# Patient Record
Sex: Female | Born: 1995 | Race: White | Hispanic: Yes | Marital: Married | Smoking: Never smoker
Health system: Southern US, Community
[De-identification: ages and names within clinical notes are randomized; demographics above are authoritative.]

---
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
## 2015-06-05 ENCOUNTER — Ambulatory Visit: Payer: Medicaid Other | Admitting: Obstetrics & Gynecology

## 2015-07-07 ENCOUNTER — Encounter: Payer: Medicaid Other | Admitting: Obstetrics & Gynecology

## 2021-09-11 ENCOUNTER — Other Ambulatory Visit: Payer: Self-pay

## 2024-05-11 ENCOUNTER — Ambulatory Visit
Admission: RE | Admit: 2024-05-11 | Discharge: 2024-05-11 | Disposition: A | Discharge status: Home / Self Care | Payer: Self-pay | Source: Ambulatory Visit

## 2024-05-11 VITALS — BP 137/85 | HR 82 | Temp 98.4°F | Resp 20

## 2024-05-11 DIAGNOSIS — H5789 Other specified disorders of eye and adnexa: Secondary | ICD-10-CM | POA: Diagnosis not present

## 2024-05-11 MED ORDER — OFLOXACIN 0.3 % OP SOLN: OPHTHALMIC | 0 refills | Status: AC

## 2024-05-11 NOTE — Discharge Instructions (Signed)
 Please follow up with your eye doctor. I have prescribed an antibiotic eyedrop. Keep contact lenses out.

## 2024-05-11 NOTE — ED Provider Notes (Signed)
 [facility]    CSN: [id number] Arrival date & time: 05/11/24  1019      History   Chief Complaint Chief Complaint  Patient presents with   Eye Problem    Possible eye infection or pink eye - Entered by patient   Eye Drainage    HPI Lindsey Mathis is a 28 y.o. female.   Patient presents with bilateral eye irritation, drainage, itchiness. Symptoms started last night. Reports that she had purulent drainage from both eyes. Denies any current blurry vision. She wears contacts daily but does not sleep in them. She took them out after symptoms started. Denies trauma or foreign body to the eyes. Has used an allergy eye drop with some improvement.    Eye Problem   History reviewed. No pertinent past medical history.  Patient Active Problem List   Diagnosis Date Noted   Sinusitis 05/05/2014    History reviewed. No pertinent surgical history.  OB History     Gravida  0   Para  0   Term  0   Preterm  0   AB  0   Living         SAB  0   IAB  0   Ectopic  0   Multiple      Live Births               Home Medications    Prior to Admission medications   Medication Sig Start Date End Date Taking? Authorizing Provider  ofloxacin (OCUFLOX) 0.3 % ophthalmic solution Place 1 drop into both eyes every 4 (four) hours for 2 days, THEN 1 drop 4 (four) times daily for 5 days. 05/11/24 05/18/24 Yes Phylis Javed, Dewey Fordyce, FNP  Prenatal Vit-Fe Fumarate-FA (MULTIVITAMIN-PRENATAL) 27-0.8 MG TABS tablet Take 1 tablet by mouth daily at 12 noon.   Yes [provider]  fluticasone  (FLONASE ) 50 MCG/ACT nasal spray Place 2 sprays into both nostrils daily. Patient not taking: Reported on 05/11/2024 05/05/14   Tammi Falconer, MD    Family History Family History  Problem Relation Age of Onset   Healthy Mother    Diabetes Father     Social History Social History   Tobacco Use   Smoking status: Never  Vaping Use   Vaping status: Never Used  Substance Use  Topics   Alcohol use: Yes    Comment: socially   Drug use: No     Allergies   Patient has no known allergies.   Review of Systems Review of Systems Per HPI  Physical Exam Triage Vital Signs ED Triage Vitals  Encounter Vitals Group     BP 05/11/24 1035 137/85     Girls Systolic BP Percentile --      Girls Diastolic BP Percentile --      Boys Systolic BP Percentile --      Boys Diastolic BP Percentile --      Pulse Rate 05/11/24 1035 82     Resp 05/11/24 1035 20     Temp 05/11/24 1035 98.4 F (36.9 C)     Temp Source 05/11/24 1035 Oral     SpO2 05/11/24 1035 96 %     Weight --      Height --      Head Circumference --      Peak Flow --      Pain Score 05/11/24 1031 3     Pain Loc --      Pain Education --  Exclude from Growth Chart --    No data found.  Updated Vital Signs BP 137/85 (BP Location: Right Arm)   Pulse 82   Temp 98.4 F (36.9 C) (Oral)   Resp 20   LMP 04/19/2024 (Exact Date)   SpO2 96%   Visual Acuity Right Eye Distance: 20/30 Left Eye Distance: 20/25 Bilateral Distance: 20/30  Right Eye Near:   Left Eye Near:    Bilateral Near:     Physical Exam Constitutional:      General: She is not in acute distress.    Appearance: Normal appearance. She is not toxic-appearing or diaphoretic.  HENT:     Head: Normocephalic and atraumatic.   Eyes:     General: Lids are normal. Lids are everted, no foreign bodies appreciated. Vision grossly intact. Gaze aligned appropriately.     Extraocular Movements: Extraocular movements intact.     Conjunctiva/sclera:     Right eye: Right conjunctiva is injected. No chemosis, exudate or hemorrhage.    Left eye: Left conjunctiva is injected. No chemosis, exudate or hemorrhage.    Pupils: Pupils are equal, round, and reactive to light.     Comments: Mild inflammation noted to bilateral conjunctiva. No drainage or swelling noted. Sclera appears normal.   Pulmonary:     Effort: Pulmonary effort is normal.    Neurological:     General: No focal deficit present.     Mental Status: She is alert and oriented to person, place, and time. Mental status is at baseline.   Psychiatric:        Mood and Affect: Mood normal.        Behavior: Behavior normal.        Thought Content: Thought content normal.        Judgment: Judgment normal.      UC Treatments / Results  Labs (all labs ordered are listed, but only abnormal results are displayed) Labs Reviewed - No data to display  EKG   Radiology No results found.  Procedures Procedures (including critical care time)  Medications Ordered in UC Medications - No data to display  Initial Impression / Assessment and Plan / UC Course  I have reviewed the triage vital signs and the nursing notes.  Pertinent labs & imaging results that were available during my care of the patient were reviewed by me and considered in my medical decision making (see chart for details).     Differential diagnoses include bacterial conjunctivitis, allergic conjunctivitis, contact lens keratitis. Low suspicion for keratitis given symptoms are bilateral and physical exam is not consistent with this, therefore fluorescein stain was deferred with shared decision making. I am most suspicion of allergy related symptoms so encouraged taking zytrec OTC. Will also prescribe ofloxacin eyedrops given patient reported purulent drainage to cover for bacterial infection. Visual acuity appears normal. Advised patient to keep her contacts lenses out for now and to wear glasses. Advised follow up as soon as possible with her established eye doctor given she wears contact lenses. Patient verbalized understanding and was agreeable with plan.  Final Clinical Impressions(s) / UC Diagnoses   Final diagnoses:  Eye irritation     Discharge Instructions      Please follow up with your eye doctor. I have prescribed an antibiotic eyedrop. Keep contact lenses out.     ED Prescriptions      Medication Sig Dispense Auth. Provider   ofloxacin (OCUFLOX) 0.3 % ophthalmic solution Place 1 drop into both eyes every 4 (four)  hours for 2 days, THEN 1 drop 4 (four) times daily for 5 days. 1.6 mL [place], [place]      PDMP not reviewed this encounter.   Dodson [place], [place] 05/11/24 1058

## 2024-05-11 NOTE — ED Triage Notes (Signed)
 Pt c/o eye discharge, itchiness x 1 day. Wears contacts on most days.Used eye drops.

## 2024-08-10 ENCOUNTER — Other Ambulatory Visit: Payer: Self-pay

## 2024-08-10 ENCOUNTER — Ambulatory Visit
Admission: RE | Admit: 2024-08-10 | Discharge: 2024-08-10 | Disposition: A | Discharge status: Home / Self Care | Payer: Self-pay | Attending: Family Medicine | Admitting: Family Medicine

## 2024-08-10 VITALS — BP 119/84 | HR 96 | Temp 98.2°F | Resp 18

## 2024-08-10 DIAGNOSIS — H6122 Impacted cerumen, left ear: Secondary | ICD-10-CM

## 2024-08-10 NOTE — ED Triage Notes (Signed)
 Pt states that about a week ago she started having a sensation that felt like she has something stuck in her ear. Pt states she used ear drops from the store to try to help but states that it felt like it made it worse. Pt denies pain or fevers. Pt states that sound is echo-y.

## 2024-08-10 NOTE — ED Provider Notes (Addendum)
 [facility]    CSN: [id number] Arrival date & time: 08/10/24  1056      History   Chief Complaint Chief Complaint  Patient presents with   Ear Fullness    Entered by patient    HPI Lindsey Mathis is a 28 y.o. female.   The history is provided by the patient.  Ear Fullness  Left ear fullness feels blocked abrupt onset after cleaning ear several days ago.  Admits history of similar symptoms in the past.  Denies drainage from ear, fever, chills, sweats, rhinorrhea, nasal congestion, sore throat, cough, swollen glands, dizziness or lightheadedness, recent travel, recent swimming.  No known drug allergies.  History reviewed. No pertinent past medical history.  Patient Active Problem List   Diagnosis Date Noted   Sinusitis 05/05/2014    History reviewed. No pertinent surgical history.  OB History     Gravida  0   Para  0   Term  0   Preterm  0   AB  0   Living         SAB  0   IAB  0   Ectopic  0   Multiple      Live Births               Home Medications    Prior to Admission medications   Not on File    Family History Family History  Problem Relation Age of Onset   Healthy Mother    Diabetes Father     Social History Social History   Tobacco Use   Smoking status: Never  Vaping Use   Vaping status: Never Used  Substance Use Topics   Alcohol use: Yes    Comment: socially   Drug use: No     Allergies   Patient has no known allergies.   Review of Systems Review of Systems   Physical Exam Triage Vital Signs ED Triage Vitals  Encounter Vitals Group     BP 08/10/24 1107 119/84     Girls Systolic BP Percentile --      Girls Diastolic BP Percentile --      Boys Systolic BP Percentile --      Boys Diastolic BP Percentile --      Pulse Rate 08/10/24 1107 96     Resp 08/10/24 1107 18     Temp 08/10/24 1107 98.2 F (36.8 C)     Temp Source 08/10/24 1107 Oral     SpO2 08/10/24 1107 97 %     Weight --       Height --      Head Circumference --      Peak Flow --      Pain Score 08/10/24 1106 0     Pain Loc --      Pain Education --      Exclude from Growth Chart --    No data found.  Updated Vital Signs BP 119/84 (BP Location: Right Arm)   Pulse 96   Temp 98.2 F (36.8 C) (Oral)   Resp 18   LMP 07/22/2024 (Approximate)   SpO2 97%   Visual Acuity Right Eye Distance:   Left Eye Distance:   Bilateral Distance:    Right Eye Near:   Left Eye Near:    Bilateral Near:     Physical Exam Vitals and nursing note reviewed.  Constitutional:      Appearance: She is not ill-appearing.  HENT:  Head: Normocephalic and atraumatic.     Right Ear: Tympanic membrane and ear canal normal.     Left Ear: There is impacted cerumen.     Nose: No rhinorrhea.     Mouth/Throat:     Mouth: Mucous membranes are moist.  Cardiovascular:     Rate and Rhythm: Normal rate.  Pulmonary:     Effort: Pulmonary effort is normal. No respiratory distress.  Musculoskeletal:     Cervical back: Neck supple.  Lymphadenopathy:     Cervical: No cervical adenopathy.  Neurological:     Mental Status: She is alert.      UC Treatments / Results  Labs (all labs ordered are listed, but only abnormal results are displayed) Labs Reviewed - No data to display  EKG   Radiology No results found.  Procedures Procedures (including critical care time)  Medications Ordered in UC Medications - No data to display  Initial Impression / Assessment and Plan / UC Course  I have reviewed the triage vital signs and the nursing notes.  Pertinent labs & imaging results that were available during my care of the patient were reviewed by me and considered in my medical decision making (see chart for details).     28 year old female with left ear fullness has cerumen impaction on exam, left ear irrigation performed by staff copious wax removed, more wax removed with curette however continues with some wax deep in  canal but not completely occluded recommend she continue over-the-counter drops and irrigate at home.  Follow-up as needed Final Clinical Impressions(s) / UC Diagnoses   Final diagnoses:  Impacted cerumen of left ear   Discharge Instructions   None    ED Prescriptions   None    PDMP not reviewed this encounter.   Thanh Mottern, PA 08/10/24 1506    Avari Gelles, [place] 08/10/24 1507

## 2024-11-16 ENCOUNTER — Ambulatory Visit
Admission: RE | Admit: 2024-11-16 | Discharge: 2024-11-16 | Disposition: A | Discharge status: Home / Self Care | Payer: Self-pay | Attending: Family Medicine | Admitting: Family Medicine

## 2024-11-16 ENCOUNTER — Other Ambulatory Visit: Payer: Self-pay

## 2024-11-16 VITALS — BP 122/85 | HR 84 | Temp 98.2°F | Resp 18

## 2024-11-16 DIAGNOSIS — H6122 Impacted cerumen, left ear: Secondary | ICD-10-CM | POA: Diagnosis not present

## 2024-11-16 NOTE — ED Provider Notes (Signed)
 [facility]    CSN: [id number] Arrival date & time: 11/16/24  1137      History   Chief Complaint Chief Complaint  Patient presents with   Ear Fullness    Entered by patient    HPI Shannon Haille Pardi is a 28 y.o. female.    Ear Fullness  Left ear fullness and decreased hearing for several days, has had similar symptoms in the past tried over-the-counter drops without relief.  States she never sees wax when she cleans her ear.  Denies problems on the right.  Denies ear pain, rhinorrhea, nasal congestion, sore throat, cough, headache, dizziness, drainage from ears, sore throat, cough.  Past Medical History:  Diagnosis Date   PCOS (polycystic ovarian syndrome)     Patient Active Problem List   Diagnosis Date Noted   Sinusitis 05/05/2014    History reviewed. No pertinent surgical history.  OB History     Gravida  0   Para  0   Term  0   Preterm  0   AB  0   Living         SAB  0   IAB  0   Ectopic  0   Multiple      Live Births               Home Medications    Prior to Admission medications  Medication Sig Start Date End Date Taking? Authorizing Provider  metformin (FORTAMET) 500 MG (OSM) 24 hr tablet Take 500 mg by mouth daily with breakfast.   Yes [provider]    Family History Family History  Problem Relation Age of Onset   Healthy Mother    Diabetes Father     Social History Social History[1]   Allergies   Patient has no known allergies.   Review of Systems Review of Systems   Physical Exam Triage Vital Signs ED Triage Vitals  Encounter Vitals Group     BP 11/16/24 1157 122/85     Girls Systolic BP Percentile --      Girls Diastolic BP Percentile --      Boys Systolic BP Percentile --      Boys Diastolic BP Percentile --      Pulse Rate 11/16/24 1157 84     Resp 11/16/24 1157 18     Temp 11/16/24 1157 98.2 F (36.8 C)     Temp Source 11/16/24 1157 Oral     SpO2 11/16/24 1157 96 %      Weight --      Height --      Head Circumference --      Peak Flow --      Pain Score 11/16/24 1155 0     Pain Loc --      Pain Education --      Exclude from Growth Chart --    No data found.  Updated Vital Signs BP 122/85 (BP Location: Right Arm)   Pulse 84   Temp 98.2 F (36.8 C) (Oral)   Resp 18   LMP 10/20/2024 (Approximate)   SpO2 96%   Visual Acuity Right Eye Distance:   Left Eye Distance:   Bilateral Distance:    Right Eye Near:   Left Eye Near:    Bilateral Near:     Physical Exam Vitals and nursing note reviewed.  Constitutional:      Appearance: She is not ill-appearing.  HENT:     Head: Normocephalic.  Right Ear: Tympanic membrane and ear canal normal.     Left Ear: There is impacted cerumen.     Nose: No rhinorrhea.  Eyes:     Conjunctiva/sclera: Conjunctivae normal.  Cardiovascular:     Rate and Rhythm: Normal rate.  Pulmonary:     Effort: Pulmonary effort is normal.  Musculoskeletal:     Cervical back: Neck supple.  Skin:    General: Skin is warm.  Neurological:     Mental Status: She is alert.   Use over-the-counter earwax removal drops follow-up with ENT next week   UC Treatments / Results  Labs (all labs ordered are listed, but only abnormal results are displayed) Labs Reviewed - No data to display  EKG   Radiology No results found.  Procedures Procedures (including critical care time)  Medications Ordered in UC Medications - No data to display  Initial Impression / Assessment and Plan / UC Course  I have reviewed the triage vital signs and the nursing notes.  Pertinent labs & imaging results that were available during my care of the patient were reviewed by me and considered in my medical decision making (see chart for details).     28 year old female left ear impacted with cerumen irrigation and curette removal unsuccessful recommend OTC earwax removal drops for 1 week then repeat irrigation at home in her PCPs  office or by ENT Final Clinical Impressions(s) / UC Diagnoses   Final diagnoses:  Impacted cerumen of left ear   Discharge Instructions   None    ED Prescriptions   None    PDMP not reviewed this encounter.    [1]  Social History Tobacco Use   Smoking status: Never  Vaping Use   Vaping status: Never Used  Substance Use Topics   Alcohol use: Yes    Comment: socially   Drug use: No     Danny Yackley, PA 11/16/24 1312  "

## 2024-11-16 NOTE — ED Triage Notes (Addendum)
 Pt states she has fullness in her LT ear. Pt states she came in here before and we could not clear it. Pt states she tried the drops and it doesn't seem like its helped. Pt states she feels like its gotten worse. Pt states she cannot hear in her LT ear. Pt denies any pain. Pt states this has been going on for 2 months.

## 2024-11-16 NOTE — Discharge Instructions (Signed)
 Use the over-the-counter earwax removal drops daily then follow-up with ENT next week
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
## 2024-12-25 ENCOUNTER — Ambulatory Visit
Admission: EM | Admit: 2024-12-25 | Discharge: 2024-12-25 | Disposition: A | Discharge status: Home / Self Care | Attending: Family Medicine | Admitting: Family Medicine

## 2024-12-25 ENCOUNTER — Other Ambulatory Visit: Payer: Self-pay

## 2024-12-25 ENCOUNTER — Ambulatory Visit (INDEPENDENT_AMBULATORY_CARE_PROVIDER_SITE_OTHER): Payer: Self-pay

## 2024-12-25 ENCOUNTER — Ambulatory Visit: Payer: Self-pay | Admitting: Physician Assistant

## 2024-12-25 DIAGNOSIS — M5412 Radiculopathy, cervical region: Secondary | ICD-10-CM

## 2024-12-25 DIAGNOSIS — M25531 Pain in right wrist: Secondary | ICD-10-CM

## 2024-12-25 NOTE — Discharge Instructions (Signed)
 Continue current medications, try to get in with your family doctor as discussed for further evaluation and possible referral to physical therapy.  Go back to the emergency department for new symptoms, severe symptoms or concerns The x-ray reading we discussed is preliminary. Your x-ray will be read by a radiologist in next few hours. If there is a discrepancy, you will be contacted, and instructed on a new plan for you care.
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
